# Patient Record
Sex: Female | Born: 2000 | Race: Black or African American | Hispanic: No | Marital: Single | Smoking: Never smoker
Health system: Southern US, Community
[De-identification: ages and names within clinical notes are randomized; demographics above are authoritative.]

## PROBLEM LIST (undated history)

## (undated) DIAGNOSIS — D696 Thrombocytopenia, unspecified: Secondary | ICD-10-CM

## (undated) DIAGNOSIS — Z789 Other specified health status: Secondary | ICD-10-CM

## (undated) DIAGNOSIS — D649 Anemia, unspecified: Secondary | ICD-10-CM

## (undated) HISTORY — PX: NO PAST SURGERIES: SHX2092

## (undated) HISTORY — DX: Thrombocytopenia, unspecified: D69.6

## (undated) HISTORY — DX: Other specified health status: Z78.9

---
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
## 2019-06-15 ENCOUNTER — Emergency Department (HOSPITAL_COMMUNITY): Payer: PRIVATE HEALTH INSURANCE

## 2019-06-15 ENCOUNTER — Emergency Department (HOSPITAL_COMMUNITY)
Admission: EM | Admit: 2019-06-15 | Discharge: 2019-06-16 | Disposition: A | Discharge status: Home / Self Care | Payer: PRIVATE HEALTH INSURANCE | Attending: Emergency Medicine | Admitting: Emergency Medicine

## 2019-06-15 ENCOUNTER — Encounter (HOSPITAL_COMMUNITY): Payer: Self-pay

## 2019-06-15 DIAGNOSIS — R102 Pelvic and perineal pain: Secondary | ICD-10-CM | POA: Insufficient documentation

## 2019-06-15 DIAGNOSIS — M549 Dorsalgia, unspecified: Secondary | ICD-10-CM | POA: Diagnosis not present

## 2019-06-15 DIAGNOSIS — S0990XA Unspecified injury of head, initial encounter: Secondary | ICD-10-CM | POA: Diagnosis not present

## 2019-06-15 DIAGNOSIS — Y939 Activity, unspecified: Secondary | ICD-10-CM | POA: Insufficient documentation

## 2019-06-15 DIAGNOSIS — Y929 Unspecified place or not applicable: Secondary | ICD-10-CM | POA: Diagnosis not present

## 2019-06-15 DIAGNOSIS — M79622 Pain in left upper arm: Secondary | ICD-10-CM | POA: Insufficient documentation

## 2019-06-15 DIAGNOSIS — Y999 Unspecified external cause status: Secondary | ICD-10-CM | POA: Insufficient documentation

## 2019-06-15 DIAGNOSIS — S299XXA Unspecified injury of thorax, initial encounter: Secondary | ICD-10-CM | POA: Diagnosis not present

## 2019-06-15 DIAGNOSIS — M79605 Pain in left leg: Secondary | ICD-10-CM | POA: Insufficient documentation

## 2019-06-15 DIAGNOSIS — T07XXXA Unspecified multiple injuries, initial encounter: Secondary | ICD-10-CM

## 2019-06-15 HISTORY — DX: Anemia, unspecified: D64.9

## 2019-06-15 LAB — CBC
HCT: 46.9 % — ABNORMAL HIGH (ref 36.0–46.0)
Hemoglobin: 15.4 g/dL — ABNORMAL HIGH (ref 12.0–15.0)
MCH: 29.4 pg (ref 26.0–34.0)
MCHC: 32.8 g/dL (ref 30.0–36.0)
MCV: 89.7 fL (ref 80.0–100.0)
Platelets: 152 10*3/uL (ref 150–400)
RBC: 5.23 MIL/uL — ABNORMAL HIGH (ref 3.87–5.11)
RDW: 13.9 % (ref 11.5–15.5)
WBC: 5 10*3/uL (ref 4.0–10.5)
nRBC: 0 % (ref 0.0–0.2)

## 2019-06-15 LAB — COMPREHENSIVE METABOLIC PANEL
ALT: 19 U/L (ref 0–44)
AST: 30 U/L (ref 15–41)
Albumin: 4.5 g/dL (ref 3.5–5.0)
Alkaline Phosphatase: 59 U/L (ref 38–126)
Anion gap: 11 (ref 5–15)
BUN: 7 mg/dL (ref 6–20)
CO2: 19 mmol/L — ABNORMAL LOW (ref 22–32)
Calcium: 9.4 mg/dL (ref 8.9–10.3)
Chloride: 107 mmol/L (ref 98–111)
Creatinine, Ser: 0.63 mg/dL (ref 0.44–1.00)
GFR calc Af Amer: >60 mL/min (ref >60)
GFR calc non Af Amer: >60 mL/min (ref >60)
Glucose, Bld: 119 mg/dL — ABNORMAL HIGH (ref 70–99)
Potassium: 3.1 mmol/L — ABNORMAL LOW (ref 3.5–5.1)
Sodium: 137 mmol/L (ref 135–145)
Total Bilirubin: 1.1 mg/dL (ref 0.3–1.2)
Total Protein: 7.7 g/dL (ref 6.5–8.1)

## 2019-06-15 LAB — LACTIC ACID, PLASMA: Lactic Acid, Venous: 3.2 mmol/L (ref 0.5–1.9)

## 2019-06-15 LAB — I-STAT BETA HCG BLOOD, ED (MC, WL, AP ONLY): I-stat hCG, quantitative: <5 m[IU]/mL (ref <5)

## 2019-06-15 LAB — PROTIME-INR
INR: 1 (ref 0.8–1.2)
Prothrombin Time: 13.1 seconds (ref 11.4–15.2)

## 2019-06-15 LAB — SAMPLE TO BLOOD BANK

## 2019-06-15 LAB — CDS SEROLOGY

## 2019-06-15 MED ORDER — FENTANYL CITRATE (PF) 100 MCG/2ML IJ SOLN
INTRAMUSCULAR | Status: AC | PRN
  Administered 2019-06-15: 50 ug via INTRAVENOUS

## 2019-06-15 MED ORDER — KETAMINE HCL 50 MG/5ML IJ SOSY: 0.3000 mg/kg | PREFILLED_SYRINGE | Freq: Once | INTRAMUSCULAR | Status: DC

## 2019-06-15 MED ORDER — LORAZEPAM 2 MG/ML IJ SOLN
1.0000 mg | Freq: Once | INTRAMUSCULAR | Status: AC | PRN
  Administered 2019-06-15: 18:00:00 1 mg via INTRAVENOUS
  Filled 2019-06-15: qty 1

## 2019-06-15 MED ORDER — KETOROLAC TROMETHAMINE 15 MG/ML IJ SOLN
15.0000 mg | Freq: Once | INTRAMUSCULAR | Status: AC
  Administered 2019-06-15: 15 mg via INTRAVENOUS
  Filled 2019-06-15: qty 1

## 2019-06-15 MED ORDER — IOHEXOL 300 MG/ML  SOLN
100.0000 mL | Freq: Once | INTRAMUSCULAR | Status: AC | PRN
  Administered 2019-06-15: 100 mL via INTRAVENOUS

## 2019-06-15 MED ORDER — MIDAZOLAM HCL 2 MG/2ML IJ SOLN
INTRAMUSCULAR | Status: AC
  Administered 2019-06-15: 2 mg
  Filled 2019-06-15: qty 2

## 2019-06-15 MED ORDER — OXYCODONE-ACETAMINOPHEN 5-325 MG PO TABS
1.0000 | ORAL_TABLET | Freq: Once | ORAL | Status: AC
  Administered 2019-06-15: 22:00:00 1 via ORAL
  Filled 2019-06-15: qty 1

## 2019-06-15 MED ORDER — KETAMINE HCL 50 MG/5ML IJ SOSY
PREFILLED_SYRINGE | INTRAMUSCULAR | Status: AC
  Filled 2019-06-15: qty 5

## 2019-06-15 MED ORDER — FENTANYL CITRATE (PF) 100 MCG/2ML IJ SOLN
INTRAMUSCULAR | Status: AC
  Filled 2019-06-15: qty 2

## 2019-06-15 NOTE — ED Notes (Signed)
Bladder scan volume 100

## 2019-06-15 NOTE — ED Notes (Addendum)
On arrival from CT, this RN and and TRN noted that pt is unable to move blt LE. Painful stimulus applied to both toes w/out response. Pt reports NO sensation to L leg, unable to move L foot. After significant encouragement, pt able to move R toes, but unable to move R leg. MD Rhunette Croft made immediately awrae.
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
## 2019-06-15 NOTE — ED Notes (Signed)
Rectal tone check by MD Rhunette Croft demonstrates decreased rectal tone

## 2019-06-15 NOTE — ED Notes (Signed)
Pt unable to stand in order to ambulate. 2 person assist, pt not bearing any weight.

## 2019-06-15 NOTE — ED Notes (Signed)
Aspen collar applied
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
## 2019-06-16 ENCOUNTER — Emergency Department (HOSPITAL_COMMUNITY): Payer: PRIVATE HEALTH INSURANCE

## 2019-06-16 DIAGNOSIS — Y929 Unspecified place or not applicable: Secondary | ICD-10-CM | POA: Diagnosis not present

## 2019-06-16 DIAGNOSIS — Y939 Activity, unspecified: Secondary | ICD-10-CM | POA: Diagnosis not present

## 2019-06-16 DIAGNOSIS — M79605 Pain in left leg: Secondary | ICD-10-CM | POA: Diagnosis not present

## 2019-06-16 DIAGNOSIS — S299XXA Unspecified injury of thorax, initial encounter: Secondary | ICD-10-CM | POA: Diagnosis not present

## 2019-06-16 DIAGNOSIS — S0990XA Unspecified injury of head, initial encounter: Secondary | ICD-10-CM | POA: Diagnosis not present

## 2019-06-16 DIAGNOSIS — M549 Dorsalgia, unspecified: Secondary | ICD-10-CM | POA: Diagnosis not present

## 2019-06-16 DIAGNOSIS — Y999 Unspecified external cause status: Secondary | ICD-10-CM | POA: Diagnosis not present

## 2019-06-16 DIAGNOSIS — M79622 Pain in left upper arm: Secondary | ICD-10-CM | POA: Diagnosis not present

## 2019-06-16 DIAGNOSIS — R102 Pelvic and perineal pain: Secondary | ICD-10-CM | POA: Diagnosis not present

## 2019-06-16 NOTE — ED Notes (Signed)
Night shift RN informed day NT that patient was given discharge instructions but was waiting on ride. Patients ride here now and patient wheeled to lobby by NT.
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
## 2019-06-16 NOTE — Discharge Instructions (Addendum)
You may take ibuprofen and Tylenol, available over-the-counter according to label instructions as needed for pain.

## 2019-06-17 ENCOUNTER — Encounter: Payer: Self-pay | Admitting: Family Medicine

## 2019-06-18 ENCOUNTER — Other Ambulatory Visit: Payer: Self-pay

## 2019-06-18 ENCOUNTER — Encounter (HOSPITAL_COMMUNITY): Payer: Self-pay | Admitting: Emergency Medicine

## 2019-06-18 ENCOUNTER — Emergency Department (HOSPITAL_COMMUNITY)
Admission: EM | Admit: 2019-06-18 | Discharge: 2019-06-18 | Disposition: A | Discharge status: Home / Self Care | Payer: Medicaid Other | Attending: Emergency Medicine | Admitting: Emergency Medicine

## 2019-06-18 DIAGNOSIS — M549 Dorsalgia, unspecified: Secondary | ICD-10-CM | POA: Insufficient documentation

## 2019-06-18 DIAGNOSIS — M79605 Pain in left leg: Secondary | ICD-10-CM | POA: Diagnosis not present

## 2019-06-18 DIAGNOSIS — M25512 Pain in left shoulder: Secondary | ICD-10-CM | POA: Insufficient documentation

## 2019-06-18 LAB — POC URINE PREG, ED: Preg Test, Ur: NEGATIVE

## 2019-06-18 MED ORDER — IBUPROFEN 400 MG PO TABS
600.0000 mg | ORAL_TABLET | Freq: Once | ORAL | Status: AC
  Administered 2019-06-18: 600 mg via ORAL
  Filled 2019-06-18: qty 1

## 2019-06-18 NOTE — Discharge Instructions (Addendum)
Please return immediately to ED when you are able to.  Please use the following pain regimen as needed. If you feel improved you may decrease the dosage of medicines.   Please follow up with [place] neurology if your symptoms persist. You may require further testing at their discretion.   Please use Tylenol or ibuprofen for pain.  You may use 600 mg ibuprofen every 6 hours or 1000 mg of Tylenol every 6 hours.  You may choose to alternate between the 2.  This would be most effective.  Not to exceed 4 g of Tylenol within 24 hours.  Not to exceed 3200 mg ibuprofen 24 hours.
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
## 2019-10-20 ENCOUNTER — Emergency Department (HOSPITAL_COMMUNITY)
Admission: EM | Admit: 2019-10-20 | Discharge: 2019-10-20 | Discharge status: Absent without leave | Payer: Medicaid Other

## 2020-02-06 ENCOUNTER — Inpatient Hospital Stay (HOSPITAL_COMMUNITY)
Admission: AD | Admit: 2020-02-06 | Discharge: 2020-02-06 | Disposition: A | Discharge status: Home / Self Care | Payer: Medicaid Other | Attending: Emergency Medicine | Admitting: Emergency Medicine

## 2020-02-06 ENCOUNTER — Encounter (HOSPITAL_COMMUNITY): Payer: Self-pay | Admitting: Emergency Medicine

## 2020-02-06 ENCOUNTER — Other Ambulatory Visit: Payer: Self-pay

## 2020-02-06 DIAGNOSIS — Z202 Contact with and (suspected) exposure to infections with a predominantly sexual mode of transmission: Secondary | ICD-10-CM

## 2020-02-06 DIAGNOSIS — Z3A01 Less than 8 weeks gestation of pregnancy: Secondary | ICD-10-CM | POA: Diagnosis not present

## 2020-02-06 DIAGNOSIS — O3461 Maternal care for abnormality of vagina, first trimester: Secondary | ICD-10-CM | POA: Diagnosis present

## 2020-02-06 NOTE — ED Provider Notes (Signed)
MSE was initiated and I personally evaluated the patient and placed orders (if any) at  6:08 PM on February 06, 2020.  The patient appears stable so that the remainder of the MSE may be completed by another provider.  19 year old female presenting requesting for an STD check.  She endorsed vaginal discharge X 1 week without any significant abdominal pain dysuria or hematuria.  No complaints of vaginal bleeding.  Last menstrual period was 01/07/2020.  On exam patient is sitting in a chair appears to be in no acute discomfort.  Abdomen soft nontender on palpation.  I have requested for MAU provider to continue with patient care.  I have reached out to OBGYN provider Edd Arbour who agrees to accept pt for further work up.   BP 124/82 (BP Location: Left Arm)   Pulse 72   Temp 99.2 F (37.3 C) (Oral)   Resp 14   LMP 01/07/2020   SpO2 99%       Fayrene Helper, PA-C 02/06/20 1813    Milagros Loll, MD 02/07/20 2233

## 2020-02-06 NOTE — MAU Note (Signed)
Presents for STI check secondary vaginal discharge.  States vaginal discharge without odor.

## 2020-02-06 NOTE — ED Triage Notes (Signed)
C/o vaginal discharge x 1 week.  Pt requesting STD check.

## 2020-02-06 NOTE — MAU Provider Note (Signed)
S Ms. Shekelia Boutin is a 19 y.o. G62P0000 non-pregnant female who presents to MAU today for STD testing.   O BP 110/63 (BP Location: Right Arm)   Pulse 69   Temp 99.1 F (37.3 C) (Oral)   Resp 20   LMP 01/07/2020   SpO2 100%  Physical Exam Constitutional:      Appearance: Normal appearance. She is normal weight.  Eyes:     Pupils: Pupils are equal, round, and reactive to light.  Cardiovascular:     Rate and Rhythm: Normal rate and regular rhythm.     Pulses: Normal pulses.  Pulmonary:     Effort: Pulmonary effort is normal.  Skin:    General: Skin is warm and dry.  Neurological:     Mental Status: She is alert and oriented to person, place, and time.  Psychiatric:        Mood and Affect: Mood normal.        Behavior: Behavior normal.        Thought Content: Thought content normal.        Judgment: Judgment normal.     A Non pregnant female Medical screening exam complete  P Discharge from MAU in stable condition Patient given the option of transfer to [facility] for further evaluation or seek care in outpatient facility of choice List of options for follow-up given  Patient may return to MAU as needed for pregnancy or emergent GYN related complaints  Edd Arbour, CNM, MSN, [facility] 02/06/20 6:41 PM

## 2020-02-06 NOTE — Discharge Instructions (Signed)
[place] [facility] Ob/Gyn Providers    [facility]      Phone: [phone]  [facility]    Phone: [phone]  [facility]   Phone: [phone]  [facility] [place]   Phone: [phone]  [facility] [place]   Phone: [phone]  [facility]    Phone: [phone]  [facility]        Phone: [phone]  [facility]     Phone: [phone]   [facility]     Phone: [phone]  [facility]     Phone: [phone]  [facility]     Phone: [phone]  [facility]        Phone: [phone]   [facility]   Phone: [phone]  [facility]     Phone: [phone]  [facility]    Phone: [phone]  [facility]       Phone: [phone]  [facility]     Phone: [phone]   

## 2020-02-06 NOTE — ED Notes (Signed)
Greta Doom, PA at triage for [facility].  Report called to [place], MAU.  Transport called to take pt.
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
## 2021-12-20 ENCOUNTER — Emergency Department (HOSPITAL_COMMUNITY)
Admission: EM | Admit: 2021-12-20 | Discharge: 2021-12-21 | Disposition: A | Discharge status: Home / Self Care | Payer: Medicaid Other | Attending: Emergency Medicine | Admitting: Emergency Medicine

## 2021-12-20 ENCOUNTER — Encounter (HOSPITAL_COMMUNITY): Payer: Self-pay

## 2021-12-20 ENCOUNTER — Other Ambulatory Visit: Payer: Self-pay

## 2021-12-20 DIAGNOSIS — R55 Syncope and collapse: Secondary | ICD-10-CM | POA: Insufficient documentation

## 2021-12-20 DIAGNOSIS — B349 Viral infection, unspecified: Secondary | ICD-10-CM | POA: Diagnosis not present

## 2021-12-20 DIAGNOSIS — J029 Acute pharyngitis, unspecified: Secondary | ICD-10-CM | POA: Diagnosis present

## 2021-12-20 DIAGNOSIS — Z20822 Contact with and (suspected) exposure to covid-19: Secondary | ICD-10-CM | POA: Insufficient documentation

## 2021-12-20 LAB — CBC
HCT: 40.3 % (ref 36.0–46.0)
Hemoglobin: 12.4 g/dL (ref 12.0–15.0)
MCH: 24.6 pg — ABNORMAL LOW (ref 26.0–34.0)
MCHC: 30.8 g/dL (ref 30.0–36.0)
MCV: 79.8 fL — ABNORMAL LOW (ref 80.0–100.0)
Platelets: 131 10*3/uL — ABNORMAL LOW (ref 150–400)
RBC: 5.05 MIL/uL (ref 3.87–5.11)
RDW: 18.2 % — ABNORMAL HIGH (ref 11.5–15.5)
WBC: 5.2 10*3/uL (ref 4.0–10.5)
nRBC: 0 % (ref 0.0–0.2)

## 2021-12-20 LAB — BASIC METABOLIC PANEL
Anion gap: 9 (ref 5–15)
BUN: 6 mg/dL (ref 6–20)
CO2: 21 mmol/L — ABNORMAL LOW (ref 22–32)
Calcium: 9.8 mg/dL (ref 8.9–10.3)
Chloride: 108 mmol/L (ref 98–111)
Creatinine, Ser: 0.54 mg/dL (ref 0.44–1.00)
GFR, Estimated: >60 mL/min (ref >60)
Glucose, Bld: 109 mg/dL — ABNORMAL HIGH (ref 70–99)
Potassium: 3.8 mmol/L (ref 3.5–5.1)
Sodium: 138 mmol/L (ref 135–145)

## 2021-12-20 LAB — CBG MONITORING, ED: Glucose-Capillary: 62 mg/dL — ABNORMAL LOW (ref 70–99)

## 2021-12-20 LAB — RESP PANEL BY RT-PCR (FLU A&B, COVID) ARPGX2
Influenza A by PCR: NEGATIVE
Influenza B by PCR: NEGATIVE
SARS Coronavirus 2 by RT PCR: NEGATIVE

## 2021-12-20 LAB — I-STAT BETA HCG BLOOD, ED (MC, WL, AP ONLY): I-stat hCG, quantitative: <5 m[IU]/mL (ref <5)

## 2021-12-20 NOTE — ED Provider Triage Note (Signed)
Emergency Medicine Provider Triage Evaluation Note  Latorria Zeoli , a 21 y.o. female  was evaluated in triage.  Pt complains of sore throat, dizziness, cough and congestion for 3 days.  She states that she felt short of breath and had a syncopal event at symptom onset 3 days ago.  She has had no additional episodes since then.  She continues to have sore throat.  She denies fevers, chest pain, shortness of breath, abdominal pain, nausea, vomiting and diarrhea. Review of Systems  Positive:  Negative:   Physical Exam  BP 131/62 (BP Location: Right Arm)   Pulse 82   Temp 99.5 F (37.5 C) (Oral)   Resp 18   SpO2 100%  Gen:   Awake, no distress   Resp:  Normal effort  MSK:   Moves extremities without difficulty  Other:    Medical Decision Making  Medically screening exam initiated at 4:08 PM.  Appropriate orders placed.  Johnnie Munar was informed that the remainder of the evaluation will be completed by another provider, this initial triage assessment does not replace that evaluation, and the importance of remaining in the ED until their evaluation is complete.     Janell Quiet, [place] 12/20/21 1609

## 2021-12-20 NOTE — ED Provider Notes (Signed)
[facility]-EMERGENCY DEPT [facility] Emergency Department Provider Note MRN:  [id number]  Arrival date & time: 12/20/21     Chief Complaint   Loss of Consciousness and Sore Throat   History of Present Illness   Heather Morrow is a 21 y.o. year-old female with a history of anemia presenting to the ED with chief complaint of loss of consciousness.  Sore throat, malaise, fatigue for few days, became lightheaded and passed out at work on Friday.  Work wants her to be evaluated.  Sore throat is better, does not really have any complaints at this time.  Review of Systems  A thorough review of systems was obtained and all systems are negative except as noted in the HPI and PMH.   Patient's Health History    Past Medical History:  Diagnosis Date   Anemia    Medical history non-contributory    Thrombocytopenia (HCC) 09/25/2018   Lowest 77k.  Did not show for Hematology consult    Past Surgical History:  Procedure Laterality Date   NO PAST SURGERIES      Family History  Problem Relation Age of Onset   Healthy Mother    Healthy Father     Social History   Socioeconomic History   Marital status: Single    Spouse name: Not on file   Number of children: Not on file   Years of education: 11th grade   Highest education level: 11th grade  Occupational History   Not on file  Tobacco Use   Smoking status: Never   Smokeless tobacco: Never   Tobacco comments:    Stopped 02/2018  Vaping Use   Vaping Use: Never used  Substance and Sexual Activity   Alcohol use: Not Currently   Drug use: Not Currently   Sexual activity: Yes    Birth control/protection: None  Other Topics Concern   Not on file  Social History Narrative   ** Merged History Encounter **       Patient is having trouble with transportation and food supply.  Advised patient to speak with social worker at next. Pt to apply for [facility] and food stamps.   Social Determinants of Health   Financial Resource Strain: High  Risk (05/01/2018)   Overall Financial Resource Strain (CARDIA)    Difficulty of Paying Living Expenses: Very hard  Food Insecurity: Food Insecurity Present (05/01/2018)   Hunger Vital Sign    Worried About Running Out of Food in the Last Year: Sometimes true    Ran Out of Food in the Last Year: Sometimes true  Transportation Needs: Unmet Transportation Needs (05/01/2018)   PRAPARE - Administrator, Civil Service (Medical): Yes    Lack of Transportation (Non-Medical): Yes  Physical Activity: Insufficiently Active (05/01/2018)   Exercise Vital Sign    Days of Exercise per Week: 2 days    Minutes of Exercise per Session: 60 min  Stress: No Stress Concern Present (05/01/2018)   [employer] of Occupational Health - Occupational Stress Questionnaire    Feeling of Stress : Only a little  Social Connections: Socially Integrated (05/01/2018)   Social Connection and Isolation Panel [NHANES]    Frequency of Communication with Friends and Family: Once a week    Frequency of Social Gatherings with Friends and Family: Twice a week    Attends Religious Services: More than 4 times per year    Active Member of [employer] or Organizations: Yes    Attends Banker Meetings: More than  4 times per year    Marital Status: Living with partner  Intimate Partner Violence: Not At Risk (05/01/2018)   Humiliation, Afraid, Rape, and Kick questionnaire    Fear of Current or Ex-Partner: No    Emotionally Abused: No    Physically Abused: No    Sexually Abused: No     Physical Exam   Vitals:   12/20/21 1957 12/20/21 2155  BP:  126/81  Pulse:  65  Resp:  16  Temp: 98.9 F (37.2 C)   SpO2:  100%    CONSTITUTIONAL: Well-appearing, NAD NEURO/PSYCH:  Alert and oriented x 3, no focal deficits EYES:  eyes equal and reactive ENT/NECK:  no LAD, no JVD CARDIO: Regular rate, well-perfused, normal S1 and S2 PULM:  CTAB no wheezing or rhonchi GI/GU:  non-distended, non-tender MSK/SPINE:  No  gross deformities, no edema SKIN:  no rash, atraumatic   *Additional and/or pertinent findings included in MDM below  Diagnostic and Interventional Summary    EKG Interpretation  Date/Time:  Monday December 20 2021 16:11:20 EDT Ventricular Rate:  77 PR Interval:  142 QRS Duration: 97 QT Interval:  373 QTC Calculation: 423 R Axis:   80 Text Interpretation: Sinus rhythm Confirmed by Kennis Carina [phone]) on 12/20/2021 11:34:53 PM       Labs Reviewed  BASIC METABOLIC PANEL - Abnormal; Notable for the following components:      Result Value   CO2 21 (*)    Glucose, Bld 109 (*)    All other components within normal limits  CBC - Abnormal; Notable for the following components:   MCV 79.8 (*)    MCH 24.6 (*)    RDW 18.2 (*)    Platelets 131 (*)    All other components within normal limits  CBG MONITORING, ED - Abnormal; Notable for the following components:   Glucose-Capillary 62 (*)    All other components within normal limits  RESP PANEL BY RT-PCR (FLU A&B, COVID) [id number]  I-STAT BETA HCG BLOOD, ED ([facility], [facility], [facility] ONLY)    No orders to display    Medications - No data to display   Procedures  /  Critical Care Procedures  ED Course and Medical Decision Making  Initial Impression and Ddx Suspect viral illness with dehydration likely triggering a syncopal episode.  Arrhythmia, abnormal cardiac conduction is considered.  Will obtain EKG.  History of anemia and so we will obtain screening labs as well.  Past medical/surgical history that increases complexity of ED encounter: Anemia  Interpretation of Diagnostics I personally reviewed the EKG and my interpretation is as follows: Sinus rhythm, normal intervals, no signs of conduction abnormalities  Labs reassuring with no significant blood count or electrolyte disturbance, no anemia, platelets at or near recent baseline.  Patient Reassessment and Ultimate Disposition/Management     Patient is appropriate for discharge with  reassurance.  Patient management required discussion with the following services or consulting groups:  None  Complexity of Problems Addressed Acute illness or injury that poses threat of life of bodily function  Additional Data Reviewed and Analyzed Further history obtained from: None  Additional Factors Impacting ED Encounter Risk None  Elmer Sow. Pilar Plate, MD [facility] Emergency Medicine [facility] [email or web address]  Final Clinical Impressions(s) / ED Diagnoses     ICD-10-CM   1. Viral illness  B34.9     2. Syncope, unspecified syncope type  R55       ED Discharge Orders  None        Discharge Instructions Discussed with and Provided to Patient:    Discharge Instructions      You were evaluated in the Emergency Department and after careful evaluation, we did not find any emergent condition requiring admission or further testing in the hospital.  Your exam/testing today is overall reassuring.  Suspect fainting spell related to dehydration and low blood sugar.  Drink more fluids, get more rest, do not miss meals.  Please return to the Emergency Department if you experience any worsening of your condition.   Thank you for allowing [place] to be a part of your care.      Maudie Flakes, MD 12/20/21 [phone]

## 2021-12-20 NOTE — ED Triage Notes (Signed)
Pt endorses sore throat, dizziness, cough, and congestion since Friday. Pt also reports passing out at work on Friday.

## 2021-12-20 NOTE — Discharge Instructions (Signed)
You were evaluated in the Emergency Department and after careful evaluation, we did not find any emergent condition requiring admission or further testing in the hospital.  Your exam/testing today is overall reassuring.  Suspect fainting spell related to dehydration and low blood sugar.  Drink more fluids, get more rest, do not miss meals.  Please return to the Emergency Department if you experience any worsening of your condition.   Thank you for allowing [place] to be a part of your care.

## 2022-02-18 ENCOUNTER — Encounter: Payer: Medicaid Other | Admitting: Family Medicine

## 2022-02-18 ENCOUNTER — Encounter: Payer: Self-pay | Admitting: Family Medicine

## 2022-02-18 ENCOUNTER — Encounter: Payer: Self-pay | Admitting: General Practice

## 2022-02-18 ENCOUNTER — Ambulatory Visit: Payer: Medicaid Other | Admitting: Family Medicine

## 2022-02-18 VITALS — BP 118/61 | HR 70 | Ht 67.0 in | Wt 127.0 lb

## 2022-02-18 DIAGNOSIS — Z30017 Encounter for initial prescription of implantable subdermal contraceptive: Secondary | ICD-10-CM | POA: Diagnosis not present

## 2022-02-18 DIAGNOSIS — Z3046 Encounter for surveillance of implantable subdermal contraceptive: Secondary | ICD-10-CM

## 2022-02-18 MED ORDER — ETONOGESTREL 68 MG ~~LOC~~ IMPL
68.0000 mg | DRUG_IMPLANT | Freq: Once | SUBCUTANEOUS | Status: AC
  Administered 2022-02-18: 68 mg via SUBCUTANEOUS

## 2022-02-18 NOTE — Progress Notes (Signed)
Patient scheduled for annual exam and nexplanon exchange. She is currently on her period and would like to reschedule her annual exam. Nexplanon placed about 3 years ago. Has had some irregular bleeding for the past 1 year.   Nexplanon Removal:  Patient given informed consent for removal of her Implanon, time out was performed.  Signed copy in the chart.  Appropriate time out taken. Implanon site identified.  Area prepped in usual sterile fashon. 5 cc of 1% lidocaine was used to anesthetize the area at the distal end of the implant. A small stab incision was made right beside the implant on the distal portion.  The implanon rod was grasped using hemostats and removed without difficulty.  There was less than 3 cc blood loss. There were no complications.  A small amount of antibiotic ointment and steri-strips were applied over the small incision.  A pressure bandage was applied to reduce any bruising.  The patient tolerated the procedure well and was given post procedure instructions.  Nexplanon Insertion:  Nexplanon removed form packaging,  Device confirmed in needle, then inserted full length of needle and withdrawn per handbook instructions.  Device palpated by physician and patient.  Pt insertion site covered with pressure dressing.   Minimal blood loss.  Pt tolerated the procedure well.

## 2022-04-28 ENCOUNTER — Ambulatory Visit: Payer: Medicaid Other | Admitting: Family Medicine

## 2022-12-21 ENCOUNTER — Telehealth: Payer: Self-pay | Admitting: Family Medicine

## 2022-12-21 MED ORDER — NORGESTIMATE-ETH ESTRADIOL 0.25-35 MG-MCG PO TABS: 1.0000 | ORAL_TABLET | Freq: Every day | ORAL | 1 refills | Status: DC

## 2022-12-21 NOTE — Telephone Encounter (Signed)
-----   Message from Nurse Liberty Handy sent at 12/21/2022  8:33 AM EDT ----- Regarding: FW: Bleeding  ----- Message ----- From: Talbert Cage Sent: 12/21/2022   8:21 AM EDT To: [facility] Subject: Bleeding                                       Patient came in to schedule an appointment this morning for her annual and nexplanon removal. I was able to schedule her at the end of August but she stated that she has been bleeding nonstop for 3 months.   She wanted me to notify Dr. Adrian Blackwater and have someone contact her.   Her best contact information is [phone].

## 2022-12-21 NOTE — Telephone Encounter (Signed)
I've sent a prescription for birth control to the pharmacy to see if we can get her bleeding under control. Have her start taking them when she picks them up. Bleeding should stop over the next 4-5 days.

## 2023-01-25 ENCOUNTER — Ambulatory Visit: Payer: Medicaid Other | Admitting: Family Medicine

## 2023-07-21 ENCOUNTER — Encounter (HOSPITAL_COMMUNITY): Payer: Self-pay

## 2023-07-21 ENCOUNTER — Emergency Department (HOSPITAL_COMMUNITY)
Admission: EM | Admit: 2023-07-21 | Discharge: 2023-07-21 | Disposition: A | Discharge status: Home / Self Care | Payer: Medicaid Other

## 2023-07-21 ENCOUNTER — Emergency Department (HOSPITAL_COMMUNITY): Payer: Medicaid Other

## 2023-07-21 ENCOUNTER — Other Ambulatory Visit: Payer: Self-pay

## 2023-07-21 DIAGNOSIS — R197 Diarrhea, unspecified: Secondary | ICD-10-CM | POA: Insufficient documentation

## 2023-07-21 DIAGNOSIS — R112 Nausea with vomiting, unspecified: Secondary | ICD-10-CM | POA: Diagnosis not present

## 2023-07-21 DIAGNOSIS — R1031 Right lower quadrant pain: Secondary | ICD-10-CM | POA: Diagnosis present

## 2023-07-21 DIAGNOSIS — R1032 Left lower quadrant pain: Secondary | ICD-10-CM | POA: Diagnosis not present

## 2023-07-21 DIAGNOSIS — R109 Unspecified abdominal pain: Secondary | ICD-10-CM

## 2023-07-21 LAB — COMPREHENSIVE METABOLIC PANEL
ALT: 16 U/L (ref 0–44)
AST: 26 U/L (ref 15–41)
Albumin: 4.1 g/dL (ref 3.5–5.0)
Alkaline Phosphatase: 52 U/L (ref 38–126)
Anion gap: 11 (ref 5–15)
BUN: 7 mg/dL (ref 6–20)
CO2: 24 mmol/L (ref 22–32)
Calcium: 9.5 mg/dL (ref 8.9–10.3)
Chloride: 105 mmol/L (ref 98–111)
Creatinine, Ser: 0.63 mg/dL (ref 0.44–1.00)
GFR, Estimated: >60 mL/min (ref >60)
Glucose, Bld: 93 mg/dL (ref 70–99)
Potassium: 3.4 mmol/L — ABNORMAL LOW (ref 3.5–5.1)
Sodium: 140 mmol/L (ref 135–145)
Total Bilirubin: 0.6 mg/dL (ref 0.0–1.2)
Total Protein: 7.9 g/dL (ref 6.5–8.1)

## 2023-07-21 LAB — CBC WITH DIFFERENTIAL/PLATELET
Abs Immature Granulocytes: 0.02 10*3/uL (ref 0.00–0.07)
Basophils Absolute: 0 10*3/uL (ref 0.0–0.1)
Basophils Relative: 0 %
Eosinophils Absolute: 0 10*3/uL (ref 0.0–0.5)
Eosinophils Relative: 1 %
HCT: 46.5 % — ABNORMAL HIGH (ref 36.0–46.0)
Hemoglobin: 15.3 g/dL — ABNORMAL HIGH (ref 12.0–15.0)
Immature Granulocytes: 0 %
Lymphocytes Relative: 23 %
Lymphs Abs: 1.2 10*3/uL (ref 0.7–4.0)
MCH: 27.9 pg (ref 26.0–34.0)
MCHC: 32.9 g/dL (ref 30.0–36.0)
MCV: 84.7 fL (ref 80.0–100.0)
Monocytes Absolute: 0.6 10*3/uL (ref 0.1–1.0)
Monocytes Relative: 10 %
Neutro Abs: 3.6 10*3/uL (ref 1.7–7.7)
Neutrophils Relative %: 66 %
Platelets: 128 10*3/uL — ABNORMAL LOW (ref 150–400)
RBC: 5.49 MIL/uL — ABNORMAL HIGH (ref 3.87–5.11)
RDW: 15.1 % (ref 11.5–15.5)
WBC: 5.4 10*3/uL (ref 4.0–10.5)
nRBC: 0 % (ref 0.0–0.2)

## 2023-07-21 LAB — LIPASE, BLOOD: Lipase: 29 U/L (ref 11–51)

## 2023-07-21 LAB — HCG, SERUM, QUALITATIVE: Preg, Serum: NEGATIVE

## 2023-07-21 MED ORDER — KETOROLAC TROMETHAMINE 30 MG/ML IJ SOLN
15.0000 mg | Freq: Once | INTRAMUSCULAR | Status: AC
  Administered 2023-07-21: 15 mg via INTRAVENOUS
  Filled 2023-07-21: qty 1

## 2023-07-21 MED ORDER — MORPHINE SULFATE (PF) 4 MG/ML IV SOLN
4.0000 mg | Freq: Once | INTRAVENOUS | Status: AC
  Administered 2023-07-21: 4 mg via INTRAVENOUS
  Filled 2023-07-21: qty 1

## 2023-07-21 MED ORDER — SODIUM CHLORIDE 0.9 % IV BOLUS
1000.0000 mL | Freq: Once | INTRAVENOUS | Status: AC
  Administered 2023-07-21: 1000 mL via INTRAVENOUS

## 2023-07-21 MED ORDER — ONDANSETRON HCL 4 MG PO TABS: 4.0000 mg | ORAL_TABLET | Freq: Four times a day (QID) | ORAL | 0 refills | Status: AC

## 2023-07-21 MED ORDER — IOHEXOL 350 MG/ML SOLN
75.0000 mL | Freq: Once | INTRAVENOUS | Status: AC | PRN
  Administered 2023-07-21: 75 mL via INTRAVENOUS

## 2023-07-21 MED ORDER — POTASSIUM CHLORIDE CRYS ER 20 MEQ PO TBCR
40.0000 meq | EXTENDED_RELEASE_TABLET | Freq: Once | ORAL | Status: AC
  Administered 2023-07-21: 40 meq via ORAL
  Filled 2023-07-21: qty 2

## 2023-07-21 MED ORDER — ONDANSETRON HCL 4 MG/2ML IJ SOLN
4.0000 mg | Freq: Once | INTRAMUSCULAR | Status: AC
  Administered 2023-07-21: 4 mg via INTRAVENOUS
  Filled 2023-07-21: qty 2

## 2023-07-21 NOTE — ED Notes (Signed)
 Patient transported to CT

## 2023-07-21 NOTE — ED Triage Notes (Signed)
Pt. Stated, I have stomach pain with N/V since Monday

## 2023-07-21 NOTE — ED Provider Notes (Signed)
[place] EMERGENCY DEPARTMENT AT [facility] Provider Note   CSN: [id number] Arrival date & time: 07/21/23  8657     History  Chief Complaint  Patient presents with   Abdominal Pain   bodyaches   Emesis   Nausea   HPI Anneli Bing is a 23 y.o. female with history of anemia presenting for abdominal pain.  Started Monday with nausea vomiting and diarrhea.  Also endorses a subjective fever.  States "I cannot keep anything down".  Denies sick contacts.  Denies recent travel.  Abdominal pain is primarily in the right lower quadrant but she states at times it radiates all about the lower abdomen.  Denies urinary symptoms.  Denies abnormal vaginal bleeding or discharge.   Abdominal Pain Associated symptoms: vomiting   Emesis Associated symptoms: abdominal pain        Home Medications Prior to Admission medications   Medication Sig Start Date End Date Taking? Authorizing Provider  acetaminophen (TYLENOL) 325 MG tablet Take 650 mg by mouth every 6 (six) hours as needed for mild pain (pain score 1-3) or moderate pain (pain score 4-6).   Yes [provider]  etonogestrel (NEXPLANON) 68 MG IMPL implant 1 each by Subdermal route once.   Yes [provider]  ondansetron (ZOFRAN) 4 MG tablet Take 1 tablet (4 mg total) by mouth every 6 (six) hours. 07/21/23  Yes Gareth Eagle, PA-C      Allergies    Patient has no known allergies.    Review of Systems   Review of Systems  Gastrointestinal:  Positive for abdominal pain and vomiting.    Physical Exam Updated Vital Signs BP 122/72   Pulse 67   Temp 98 F (36.7 C) (Oral)   Resp 20   Ht 5\' 7"  (1.702 m)   Wt 49 kg   LMP 05/02/2023   SpO2 100%   BMI 16.92 kg/m  Physical Exam Vitals and nursing note reviewed.  HENT:     Head: Normocephalic and atraumatic.     Mouth/Throat:     Mouth: Mucous membranes are moist.  Eyes:     General:        Right eye: No discharge.        Left eye: No discharge.      Conjunctiva/sclera: Conjunctivae normal.  Cardiovascular:     Rate and Rhythm: Normal rate and regular rhythm.     Pulses: Normal pulses.     Heart sounds: Normal heart sounds.  Pulmonary:     Effort: Pulmonary effort is normal.     Breath sounds: Normal breath sounds.  Abdominal:     General: Abdomen is flat.     Palpations: Abdomen is soft.     Tenderness: There is abdominal tenderness in the right lower quadrant, suprapubic area and left lower quadrant. There is no right CVA tenderness, left CVA tenderness or guarding.  Skin:    General: Skin is warm and dry.  Neurological:     General: No focal deficit present.  Psychiatric:        Mood and Affect: Mood normal.     ED Results / Procedures / Treatments   Labs (all labs ordered are listed, but only abnormal results are displayed) Labs Reviewed  CBC WITH DIFFERENTIAL/PLATELET - Abnormal; Notable for the following components:      Result Value   RBC 5.49 (*)    Hemoglobin 15.3 (*)    HCT 46.5 (*)    Platelets 128 (*)  All other components within normal limits  COMPREHENSIVE METABOLIC PANEL - Abnormal; Notable for the following components:   Potassium 3.4 (*)    All other components within normal limits  LIPASE, BLOOD  HCG, SERUM, QUALITATIVE    EKG None  Radiology CT ABDOMEN PELVIS W CONTRAST Result Date: 07/21/2023 CLINICAL DATA:  Abdominal pain with nausea and vomiting since Monday. EXAM: CT ABDOMEN AND PELVIS WITH CONTRAST TECHNIQUE: Multidetector CT imaging of the abdomen and pelvis was performed using the standard protocol following bolus administration of intravenous contrast. RADIATION DOSE REDUCTION: This exam was performed according to the departmental dose-optimization program which includes automated exposure control, adjustment of the mA and/or kV according to patient size and/or use of iterative reconstruction technique. CONTRAST:  75mL OMNIPAQUE IOHEXOL 350 MG/ML SOLN COMPARISON:  CT abdomen pelvis  dated June 15, 2019. FINDINGS: Lower chest: No acute abnormality. Hepatobiliary: No focal liver abnormality is seen. No gallstones, gallbladder wall thickening, or biliary dilatation. Pancreas: Unremarkable. No pancreatic ductal dilatation or surrounding inflammatory changes. Spleen: Normal in size without focal abnormality. Adrenals/Urinary Tract: Adrenal glands are unremarkable. Kidneys are normal, without renal calculi, focal lesion, or hydronephrosis. Bladder is unremarkable. Stomach/Bowel: Stomach is within normal limits. Appendix appears normal. No evidence of bowel wall thickening, distention, or inflammatory changes. Vascular/Lymphatic: No significant vascular findings are present. No enlarged abdominal or pelvic lymph nodes. Reproductive: Uterus and bilateral adnexa are unremarkable. Other: No abdominal wall hernia or abnormality. No abdominopelvic ascites. No pneumoperitoneum. Musculoskeletal: No acute or significant osseous findings. Chronic bilateral L5 pars defects again noted. IMPRESSION: 1. No acute intra-abdominal process. Electronically Signed   By: Obie Dredge M.D.   On: 07/21/2023 11:03    Procedures Procedures    Medications Ordered in ED Medications  ketorolac (TORADOL) 30 MG/ML injection 15 mg (has no administration in time range)  potassium chloride SA (KLOR-CON M) CR tablet 40 mEq (has no administration in time range)  morphine (PF) 4 MG/ML injection 4 mg (4 mg Intravenous Given 07/21/23 0808)  sodium chloride 0.9 % bolus 1,000 mL (0 mLs Intravenous Stopped 07/21/23 0921)  ondansetron (ZOFRAN) injection 4 mg (4 mg Intravenous Given 07/21/23 0808)  iohexol (OMNIPAQUE) 350 MG/ML injection 75 mL (75 mLs Intravenous Contrast Given 07/21/23 1024)    ED Course/ Medical Decision Making/ A&P                                 Medical Decision Making Amount and/or Complexity of Data Reviewed Labs: ordered. Radiology: ordered.  Risk Prescription drug management.  Initial  Impression and Ddx 23 year old well-appearing female presenting for abdominal pain with nausea vomiting and diarrhea.  Exam notable for lower abdominal tenderness.  DDx includes appendicitis, diverticulitis, ruptured ovarian cyst, ectopic pregnancy, other. Patient PMH that increases complexity of ED encounter:  history of anemia  Interpretation of Diagnostics I independent reviewed and interpreted the labs as followed: Thrombocytopenia, near baseline, hypokalemia  - I independently visualized the following imaging with scope of interpretation limited to determining acute life threatening conditions related to emergency care: CT ab/pelvis, which revealed no acute findings  Patient Reassessment and Ultimate Disposition/Management On reassessment, patient stated that her abdominal pain had improved.  Workup overall reassuring.  Suspect viral gastroenteritis.  Advised supportive treatment at home.  P.o. challenge without complication.  Sent Zofran to her pharmacy.  Discussed per return precautions.  Advised to follow-up with PCP.  Discharged good condition.   Patient management required  discussion with the following services or consulting groups:  None  Complexity of Problems Addressed Acute complicated illness or Injury  Additional Data Reviewed and Analyzed Further history obtained from: Past medical history and medications listed in the EMR and Prior ED visit notes  Patient Encounter Risk Assessment Prescriptions         Final Clinical Impression(s) / ED Diagnoses Final diagnoses:  Nausea vomiting and diarrhea  Abdominal pain, unspecified abdominal location    Rx / DC Orders ED Discharge Orders          Ordered    ondansetron (ZOFRAN) 4 MG tablet  Every 6 hours        07/21/23 1226              Gareth Eagle, PA-C 07/21/23 1227    Coral Spikes, DO 07/21/23 [phone]

## 2023-07-21 NOTE — Discharge Instructions (Addendum)
Evaluation today was overall reassuring.  Suspect a viral GI illness.  Treatment is supportive at home which includes hydration with Gatorade and water and you can take Zofran as well to help with your vomiting and nausea.  If you develop worsening abdominal pain, a fever, inability to tolerate fluid intake or any other concerning symptom please return emergency department further evaluation.  Otherwise recommend you follow-up with your PCP.

## 2023-07-21 NOTE — ED Notes (Signed)
Water provided for fluid challenge. 

## 2023-09-26 ENCOUNTER — Ambulatory Visit (INDEPENDENT_AMBULATORY_CARE_PROVIDER_SITE_OTHER): Admitting: Obstetrics and Gynecology

## 2023-09-26 VITALS — BP 123/74 | HR 83 | Ht 67.0 in | Wt 149.0 lb

## 2023-09-26 DIAGNOSIS — Z3046 Encounter for surveillance of implantable subdermal contraceptive: Secondary | ICD-10-CM

## 2023-09-28 MED ORDER — NORETHIN ACE-ETH ESTRAD-FE 1-20 MG-MCG(24) PO TABS: 1.0000 | ORAL_TABLET | Freq: Every day | ORAL | 11 refills | Status: AC

## 2023-09-28 NOTE — Progress Notes (Signed)
     GYNECOLOGY OFFICE PROCEDURE NOTE  Heather Morrow is a 23 y.o. G1P0000 here for [name]  [name] Patient identified, informed consent performed, consent signed.   Appropriate time out taken.   Nexplanon  site identified.  Area prepped in usual sterile fashon. One ml of 1% lidocaine  was used to anesthetize the area at the distal end of the implant. A small stab incision was made right beside the implant on the distal portion.  The Nexplanon  rod was grasped using hemostats and removed without difficulty.  There was minimal blood loss. There were no complications.  3 ml of 1% lidocaine  was injected around the incision for post-procedure analgesia.  Steri-strips were applied over the small incision.  A pressure bandage was applied to reduce any bruising.    The patient tolerated the procedure well and was given post procedure instructions.  Patient is planning to "use OCP for contraception.  Susi Eric, FNP [facility] [employer]

## 2023-10-18 ENCOUNTER — Ambulatory Visit: Admitting: Family Medicine

## 2023-10-18 ENCOUNTER — Encounter: Payer: Self-pay | Admitting: Family Medicine

## 2023-10-18 ENCOUNTER — Other Ambulatory Visit (HOSPITAL_COMMUNITY)
Admission: RE | Admit: 2023-10-18 | Discharge: 2023-10-18 | Disposition: A | Discharge status: Home / Self Care | Source: Ambulatory Visit | Attending: Licensed Practical Nurse | Admitting: Licensed Practical Nurse

## 2023-10-18 VITALS — BP 108/67 | HR 69 | Ht 67.0 in | Wt 141.0 lb

## 2023-10-18 DIAGNOSIS — Z7721 Contact with and (suspected) exposure to potentially hazardous body fluids: Secondary | ICD-10-CM | POA: Diagnosis not present

## 2023-10-18 DIAGNOSIS — Z113 Encounter for screening for infections with a predominantly sexual mode of transmission: Secondary | ICD-10-CM | POA: Diagnosis not present

## 2023-10-18 DIAGNOSIS — Z1331 Encounter for screening for depression: Secondary | ICD-10-CM | POA: Diagnosis not present

## 2023-10-18 DIAGNOSIS — Z01419 Encounter for gynecological examination (general) (routine) without abnormal findings: Secondary | ICD-10-CM | POA: Insufficient documentation

## 2023-10-18 LAB — POCT URINE PREGNANCY: Preg Test, Ur: NEGATIVE

## 2023-10-18 NOTE — Progress Notes (Signed)
 ANNUAL EXAM Patient name: Heather Morrow MRN [id number]  Date of birth: 2000/12/08 Chief Complaint:   Annual Exam  History of Present Illness:   Heather Morrow is a 23 y.o.  G1P0000  female  being seen today for a routine annual exam.  Current complaints: concerned that her husband has another sexual partner. No menses since nexplanon  removed.  Patient's last menstrual period was 07/29/2023 (exact date).    Last pap due.  Last mammogram: n/a     10/18/2023   10:57 AM  Depression screen PHQ 2/9  Decreased Interest 0  Down, Depressed, Hopeless 0  PHQ - 2 Score 0  Altered sleeping 0  Tired, decreased energy 2  Change in appetite 3  Feeling bad or failure about yourself  0  Trouble concentrating 0  Moving slowly or fidgety/restless 0  Suicidal thoughts 0  PHQ-9 Score 5  Difficult doing work/chores Not difficult at all        10/18/2023   10:57 AM  GAD 7 : Generalized Anxiety Score  Nervous, Anxious, on Edge 0  Control/stop worrying 0  Worry too much - different things 0  Trouble relaxing 0  Restless 0  Easily annoyed or irritable 0  Afraid - awful might happen 0  Total GAD 7 Score 0  Anxiety Difficulty Not difficult at all     Review of Systems:   Pertinent items are noted in HPI Denies any headaches, blurred vision, fatigue, shortness of breath, chest pain, abdominal pain, abnormal vaginal discharge/itching/odor/irritation, problems with periods, bowel movements, urination, or intercourse unless otherwise stated above. Pertinent History Reviewed:  Reviewed past medical,surgical, social and family history.  Reviewed problem list, medications and allergies. Physical Assessment:   Vitals:   10/18/23 0956  BP: 108/67  Pulse: 69  Weight: 141 lb (64 kg)  Height: 5\' 7"  (1.702 m)  Body mass index is 22.08 kg/m.        Physical Examination:   General appearance - well appearing, and in no distress  Mental status - alert, oriented to person, place, and time  Psych:   She has a normal mood and affect  Skin - warm and dry, normal color, no suspicious lesions noted  Chest - effort normal, all lung fields clear to auscultation bilaterally  Heart - normal rate and regular rhythm  Neck:  midline trachea, no thyromegaly or nodules  Breasts - breasts appear normal, no suspicious masses, no skin or nipple changes or axillary nodes  Abdomen - soft, nontender, nondistended, no masses or organomegaly  Pelvic - VULVA: normal appearing vulva with no masses, tenderness or lesions  VAGINA: normal appearing vagina with normal color and discharge, no lesions  CERVIX: normal appearing cervix without discharge or lesions, no CMT  Thin prep pap is done with HR HPV cotesting  UTERUS: uterus is felt to be normal size, shape, consistency and nontender   ADNEXA: No adnexal masses or tenderness noted.  Extremities:  No swelling or varicosities noted  Chaperone present for exam  Assessment & Plan:  1. Well woman exam with routine gynecological exam (Primary) PAP today  2. Exposure to body fluid STI screening UPT   Orders Placed This Encounter  Procedures   HIV antibody (with reflex)   Hepatitis C Antibody   Hepatitis B Surface AntiGEN   RPR   POCT urine pregnancy    Meds: No orders of the defined types were placed in this encounter.   Follow-up: No follow-ups on file.  Crystel Demarco J Aleister Lady, DO  10/20/2023 12:19 PM

## 2023-10-19 LAB — RPR: RPR Ser Ql: NONREACTIVE

## 2023-10-19 LAB — HEPATITIS B SURFACE ANTIGEN: Hepatitis B Surface Ag: NEGATIVE

## 2023-10-19 LAB — HIV ANTIBODY (ROUTINE TESTING W REFLEX): HIV Screen 4th Generation wRfx: NONREACTIVE

## 2023-10-19 LAB — HEPATITIS C ANTIBODY: Hep C Virus Ab: NONREACTIVE

## 2023-10-24 LAB — CYTOLOGY - PAP
Chlamydia: NEGATIVE
Comment: NEGATIVE
Comment: NEGATIVE
Comment: NORMAL
Diagnosis: NEGATIVE
Neisseria Gonorrhea: NEGATIVE
Trichomonas: POSITIVE — AB

## 2023-10-25 ENCOUNTER — Ambulatory Visit: Payer: Self-pay | Admitting: Family Medicine

## 2023-10-25 MED ORDER — METRONIDAZOLE 500 MG PO TABS: 500.0000 mg | ORAL_TABLET | Freq: Two times a day (BID) | ORAL | 0 refills | Status: AC

## 2024-06-11 ENCOUNTER — Emergency Department (HOSPITAL_COMMUNITY)
Admission: EM | Admit: 2024-06-11 | Discharge: 2024-06-11 | Disposition: A | Discharge status: Home / Self Care | Attending: Emergency Medicine | Admitting: Emergency Medicine

## 2024-06-11 DIAGNOSIS — N3001 Acute cystitis with hematuria: Secondary | ICD-10-CM | POA: Insufficient documentation

## 2024-06-11 DIAGNOSIS — J069 Acute upper respiratory infection, unspecified: Secondary | ICD-10-CM | POA: Diagnosis not present

## 2024-06-11 DIAGNOSIS — R059 Cough, unspecified: Secondary | ICD-10-CM | POA: Diagnosis present

## 2024-06-11 DIAGNOSIS — D72819 Decreased white blood cell count, unspecified: Secondary | ICD-10-CM | POA: Insufficient documentation

## 2024-06-11 LAB — COMPREHENSIVE METABOLIC PANEL WITH GFR
ALT: 23 U/L (ref 0–44)
AST: 32 U/L (ref 15–41)
Albumin: 4.2 g/dL (ref 3.5–5.0)
Alkaline Phosphatase: 59 U/L (ref 38–126)
Anion gap: 8 (ref 5–15)
BUN: 7 mg/dL (ref 6–20)
CO2: 25 mmol/L (ref 22–32)
Calcium: 9.3 mg/dL (ref 8.9–10.3)
Chloride: 105 mmol/L (ref 98–111)
Creatinine, Ser: 0.53 mg/dL (ref 0.44–1.00)
GFR, Estimated: >60 mL/min
Glucose, Bld: 84 mg/dL (ref 70–99)
Potassium: 4 mmol/L (ref 3.5–5.1)
Sodium: 137 mmol/L (ref 135–145)
Total Bilirubin: 0.4 mg/dL (ref 0.0–1.2)
Total Protein: 7.1 g/dL (ref 6.5–8.1)

## 2024-06-11 LAB — CBC WITH DIFFERENTIAL/PLATELET
Abs Immature Granulocytes: 0 K/uL (ref 0.00–0.07)
Basophils Absolute: 0 K/uL (ref 0.0–0.1)
Basophils Relative: 1 %
Eosinophils Absolute: 0.1 K/uL (ref 0.0–0.5)
Eosinophils Relative: 2 %
HCT: 40 % (ref 36.0–46.0)
Hemoglobin: 13.2 g/dL (ref 12.0–15.0)
Immature Granulocytes: 0 %
Lymphocytes Relative: 49 %
Lymphs Abs: 1.7 K/uL (ref 0.7–4.0)
MCH: 28.7 pg (ref 26.0–34.0)
MCHC: 33 g/dL (ref 30.0–36.0)
MCV: 87 fL (ref 80.0–100.0)
Monocytes Absolute: 0.3 K/uL (ref 0.1–1.0)
Monocytes Relative: 8 %
Neutro Abs: 1.4 K/uL — ABNORMAL LOW (ref 1.7–7.7)
Neutrophils Relative %: 40 %
Platelets: 108 K/uL — ABNORMAL LOW (ref 150–400)
RBC: 4.6 MIL/uL (ref 3.87–5.11)
RDW: 16.6 % — ABNORMAL HIGH (ref 11.5–15.5)
Smear Review: NORMAL
WBC: 3.5 K/uL — ABNORMAL LOW (ref 4.0–10.5)
nRBC: 0 % (ref 0.0–0.2)

## 2024-06-11 LAB — URINALYSIS, ROUTINE W REFLEX MICROSCOPIC
Bilirubin Urine: NEGATIVE
Glucose, UA: NEGATIVE mg/dL
Ketones, ur: NEGATIVE mg/dL
Nitrite: NEGATIVE
Protein, ur: NEGATIVE mg/dL
Specific Gravity, Urine: 1.009 (ref 1.005–1.030)
pH: 7 (ref 5.0–8.0)

## 2024-06-11 LAB — LIPASE, BLOOD: Lipase: 28 U/L (ref 11–51)

## 2024-06-11 LAB — HCG, QUANTITATIVE, PREGNANCY: hCG, Beta Chain, Quant, S: <1 m[IU]/mL

## 2024-06-11 MED ORDER — CEPHALEXIN 500 MG PO CAPS: 500.0000 mg | ORAL_CAPSULE | Freq: Two times a day (BID) | ORAL | 0 refills | Status: AC

## 2024-06-11 NOTE — ED Provider Notes (Signed)
 " [place] EMERGENCY DEPARTMENT AT [facility] Provider Note   CSN: [id number] Arrival date & time: 06/11/24  9187     Patient presents with: Hematemesis and Epistaxis   Heather Morrow is a 24 y.o. female.  With pertinent medical history of anemia and thrombocytopenia.   Patient is here for evaluation of cough, congestion, hematemesis, body aches, fatigue, and lower abdominal pain.  Cough, congestion, body aches, fatigue, and vomiting began last Wednesday.  She has had a total of 3 episodes of emesis since symptoms began.  1 episode of emesis on Wednesday, 1 on Friday, and 1 today.  She became concerned when she noticed a small amount of bright red blood with today's vomiting.  Lower abdominal pain began on Saturday.  She denies flank pain, dysuria, frank hematuria, foul-smelling urine, increased urinary frequency or urgency.  Denies diarrhea or constipation.  Denies falls or syncope.  Exposed to a coworker who has had similar URI symptoms.  The history is provided by the patient.       Prior to Admission medications  Medication Sig Start Date End Date Taking? Authorizing Provider  cephALEXin  (KEFLEX ) 500 MG capsule Take 1 capsule (500 mg total) by mouth 2 (two) times daily for 7 days. 06/11/24 06/18/24 Yes Rosina Almarie LABOR, PA-C  acetaminophen  (TYLENOL ) 325 MG tablet Take 650 mg by mouth every 6 (six) hours as needed for mild pain (pain score 1-3) or moderate pain (pain score 4-6). Patient not taking: Reported on 10/18/2023    [provider]  metroNIDAZOLE  (FLAGYL ) 500 MG tablet Take 1 tablet (500 mg total) by mouth 2 (two) times daily. 10/25/23   Stinson, Jacob J, DO  Norethindrone Acetate-Ethinyl Estrad-FE (LOESTRIN 24 FE) 1-20 MG-MCG(24) tablet Take 1 tablet by mouth daily. Patient not taking: Reported on 10/18/2023 09/28/23   Delores Nidia CROME, FNP  ondansetron  (ZOFRAN ) 4 MG tablet Take 1 tablet (4 mg total) by mouth every 6 (six) hours. Patient not taking: Reported  on 10/18/2023 07/21/23   Robinson, John K, PA-C    Allergies: Patient has no known allergies.    Review of Systems  HENT:  Positive for congestion.   Respiratory:  Positive for cough.   Gastrointestinal:  Positive for abdominal pain and vomiting.    Updated Vital Signs BP (!) 117/90   Pulse 71   Temp 98.2 F (36.8 C) (Oral)   Resp 16   LMP 05/16/2024   SpO2 100%   Physical Exam Vitals and nursing note reviewed.  Constitutional:      General: She is not in acute distress.    Appearance: Normal appearance. She is normal weight. She is not ill-appearing, toxic-appearing or diaphoretic.  HENT:     Head: Normocephalic and atraumatic.     Right Ear: Tympanic membrane, ear canal and external ear normal.     Left Ear: Tympanic membrane, ear canal and external ear normal.     Nose: Congestion present. No rhinorrhea.     Mouth/Throat:     Mouth: Mucous membranes are moist.     Pharynx: Oropharynx is clear. No oropharyngeal exudate or posterior oropharyngeal erythema.  Eyes:     General: No scleral icterus.    Extraocular Movements: Extraocular movements intact.     Conjunctiva/sclera: Conjunctivae normal.     Pupils: Pupils are equal, round, and reactive to light.  Cardiovascular:     Rate and Rhythm: Normal rate and regular rhythm.     Pulses: Normal pulses.     Heart  sounds: Normal heart sounds.  Pulmonary:     Effort: Pulmonary effort is normal. No respiratory distress.     Breath sounds: Normal breath sounds. No stridor. No wheezing, rhonchi or rales.  Abdominal:     General: Abdomen is flat. Bowel sounds are normal. There is no distension.     Palpations: Abdomen is soft.     Tenderness: There is abdominal tenderness in the suprapubic area. There is no right CVA tenderness, left CVA tenderness or guarding. Negative signs include Murphy's sign, Rovsing's sign and McBurney's sign.  Musculoskeletal:        General: Normal range of motion.     Cervical back: Normal range of  motion.  Skin:    General: Skin is warm and dry.     Capillary Refill: Capillary refill takes less than 2 seconds.     Coloration: Skin is not jaundiced or pale.  Neurological:     Mental Status: She is alert and oriented to person, place, and time.     (all labs ordered are listed, but only abnormal results are displayed) Labs Reviewed  CBC WITH DIFFERENTIAL/PLATELET - Abnormal; Notable for the following components:      Result Value   WBC 3.5 (*)    RDW 16.6 (*)    Platelets 108 (*)    Neutro Abs 1.4 (*)    All other components within normal limits  URINALYSIS, ROUTINE W REFLEX MICROSCOPIC - Abnormal; Notable for the following components:   APPearance HAZY (*)    Hgb urine dipstick SMALL (*)    Leukocytes,Ua LARGE (*)    Bacteria, UA RARE (*)    All other components within normal limits  COMPREHENSIVE METABOLIC PANEL WITH GFR  LIPASE, BLOOD  HCG, QUANTITATIVE, PREGNANCY    EKG: None  Radiology: No results found.   Procedures   Medications Ordered in the ED - No data to display   Patient presents to the ED for concern of flu-like symptoms and low abdominal pain, this involves an extensive number of treatment options, and is a complaint that carries with it a high risk of complications and morbidity.  The differential diagnosis includes viral URI, influenza, COVID, RSV, pneumonia, UTI, IBS, gastroenteritis.   Lab Tests:  I Ordered, and personally interpreted labs.  The pertinent results include: Normal hemoglobin.  Leukopenia of 3.5 and neutropenia 1.4.  Urinalysis showing small amount hemoglobin, large leukocytes, and bacteria.   Problem List / ED Course:     Physical exam and HPI consistent with viral URI.  Leukopenia of 3.5 and neutropenia 1.4, likely transient in the setting of viral illness. Urinalysis indicative of infection.  Viral URI. Physical exam, vitals, and history are consistent with viral URI with no acute or concerning findings. Spoke with  patient about diagnosis, outpatient treatment, return precautions and they verbalized understanding.  Stable for discharge. UTI. Physical exam, history, and urinalysis consistent with UTI with no acute or concerning findings. Antibiotics sent to pharmacy.   Reevaluation:  After the interventions noted above, I reevaluated the patient and found that they have :improved   Dispostion:  After consideration of the diagnostic results and the patients response to treatment, I feel that the patent would benefit from supportive care in the home setting and symptom management using over-the-counter medications for URI and prescription antibiotics for UTI with close follow-up in primary care for evaluation of improvement of symptoms.  Return precautions given.  Medical Decision Making Amount and/or Complexity of Data Reviewed Labs: ordered.   This note was produced using [employer]. While the provider has reviewed and verified all clinical information, transcription errors may remain.    Final diagnoses:  Viral URI with cough  Acute cystitis with hematuria    ED Discharge Orders          Ordered    cephALEXin  (KEFLEX ) 500 MG capsule  2 times daily        06/11/24 1629               Rosina Almarie DELENA DEVONNA 06/11/24 1645    Pamella Sharper A, DO 06/18/24 0001  "

## 2024-06-11 NOTE — Discharge Instructions (Signed)
 As we discussed you likely have a viral upper respiratory illness explaining your cough, congestion, fatigue, muscle aches, and vomiting. See below for treatment recommendations for upper respiratory illness.    Your urine is also positive for an infection. I have sent antibiotics to your pharmacy. Follow-up with your primary care physician in 1 week for ongoing evaluation of symptom resolution. Please return if symptoms worsen or any new concerning symptoms develop.  Read the instructions below on reasons to return to the emergency department and to learn more about your diagnosis.  Use over the counter medications for symptomatic relief as we discussed (musinex as a decongestant, Tylenol  for fever/pain, Motrin /Ibuprofen  for muscle aches). Followup with your primary care doctor in 4 days if your symptoms persist.  You're more than welcome to return to the emergency department if symptoms worsen or become concerning.  Upper Respiratory Infection, Adult  An upper respiratory infection (URI) is also sometimes known as the common cold. Most people improve within 1 week, but symptoms can last up to 2 weeks. A residual cough may last even longer.   URI is most commonly caused by a virus. Viruses are NOT treated with antibiotics. You can easily spread the virus to others by oral contact. This includes kissing, sharing a glass, coughing, or sneezing. Touching your mouth or nose and then touching a surface, which is then touched by another person, can also spread the virus.   TREATMENT  Treatment is directed at relieving symptoms. There is no cure. Antibiotics are not effective, because the infection is caused by a virus, not by bacteria. Treatment may include:  Increased fluid intake. Sports drinks offer valuable electrolytes, sugars, and fluids.  Breathing heated mist or steam (vaporizer or shower).  Eating chicken soup or other clear broths, and maintaining good nutrition.  Getting plenty of rest.  Using  gargles or lozenges for comfort.  Controlling fevers with ibuprofen  or acetaminophen  as directed by your caregiver.  Increasing usage of your inhaler if you have asthma.  Return to work when your temperature has returned to normal.   SEEK MEDICAL CARE IF:  After the first few days, you feel you are getting worse rather than better.  You develop worsening shortness of breath, or brown or red sputum. These may be signs of pneumonia.  You develop yellow or brown nasal discharge or pain in the face, especially when you bend forward. These may be signs of sinusitis.  You develop a fever, swollen neck glands, pain with swallowing, or white areas in the back of your throat. These may be signs of strep throat.

## 2024-06-11 NOTE — ED Triage Notes (Addendum)
 Patient reports fever/cough/hematemesis/epistaxis started last week. No active bleeding/vomiting. Patient is alert and oriented x 4. Airway patent, respirations even and unlabored. Skin normal, warm and dry.
